# Patient Record
Sex: Male | Born: 2005 | Race: Black or African American | Hispanic: No | Marital: Single | Smoking: Never smoker
Health system: Southern US, Community
[De-identification: ages and names within clinical notes are randomized; demographics above are authoritative.]

---
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
## 2024-04-04 ENCOUNTER — Other Ambulatory Visit: Payer: Self-pay

## 2024-04-04 ENCOUNTER — Encounter (HOSPITAL_BASED_OUTPATIENT_CLINIC_OR_DEPARTMENT_OTHER): Payer: Self-pay | Admitting: Emergency Medicine

## 2024-04-04 DIAGNOSIS — Y92410 Unspecified street and highway as the place of occurrence of the external cause: Secondary | ICD-10-CM | POA: Insufficient documentation

## 2024-04-04 DIAGNOSIS — M25562 Pain in left knee: Secondary | ICD-10-CM | POA: Diagnosis present

## 2024-04-04 DIAGNOSIS — S8002XA Contusion of left knee, initial encounter: Secondary | ICD-10-CM | POA: Diagnosis not present
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
## 2024-04-05 ENCOUNTER — Emergency Department (HOSPITAL_BASED_OUTPATIENT_CLINIC_OR_DEPARTMENT_OTHER)

## 2024-04-05 ENCOUNTER — Emergency Department (HOSPITAL_BASED_OUTPATIENT_CLINIC_OR_DEPARTMENT_OTHER)
Admission: EM | Admit: 2024-04-05 | Discharge: 2024-04-05 | Disposition: A | Discharge status: Home / Self Care | Attending: Emergency Medicine | Admitting: Emergency Medicine

## 2024-04-05 DIAGNOSIS — S8002XA Contusion of left knee, initial encounter: Secondary | ICD-10-CM

## 2024-04-05 MED ORDER — IBUPROFEN 600 MG PO TABS: 600.0000 mg | ORAL_TABLET | Freq: Four times a day (QID) | ORAL | 0 refills | Status: AC | PRN
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
